# Patient Record
Sex: Male | Born: 1995 | Race: White | Hispanic: No | Marital: Single | Smoking: Never smoker
Health system: Southern US, Community
[De-identification: ages and names within clinical notes are randomized; demographics above are authoritative.]

---

## 2022-01-26 ENCOUNTER — Ambulatory Visit (INDEPENDENT_AMBULATORY_CARE_PROVIDER_SITE_OTHER): Payer: BC Managed Care – PPO

## 2022-01-26 ENCOUNTER — Other Ambulatory Visit: Payer: Self-pay

## 2022-01-26 ENCOUNTER — Ambulatory Visit
Admission: EM | Admit: 2022-01-26 | Discharge: 2022-01-26 | Disposition: A | Discharge status: Home / Self Care | Payer: BC Managed Care – PPO

## 2022-01-26 ENCOUNTER — Encounter: Payer: Self-pay | Admitting: Emergency Medicine

## 2022-01-26 DIAGNOSIS — M79641 Pain in right hand: Secondary | ICD-10-CM | POA: Diagnosis not present

## 2022-01-26 DIAGNOSIS — S62336A Displaced fracture of neck of fifth metacarpal bone, right hand, initial encounter for closed fracture: Secondary | ICD-10-CM

## 2022-01-26 NOTE — Discharge Instructions (Signed)
-  Unfortunately, you fractured your fifth metacarpal bone.  This is, and called a boxer's fracture.  It is a bit displaced we will need to definitely follow-up with orthopedics.  We have placed you in a brace and you should avoid using this hand until cleared by orthopedics.  You may take it out to ice it and you can take ibuprofen and Tylenol for pain. ? ?You have a condition requiring you to follow up with Orthopedics so please call one of the following office for appointment:  ? ?[facility] ?[address] [place], [place] [address] ?Phone: [phone] ? ?[facility] ?[address], [place], [place] [address] ?Phone: [phone]  ?

## 2022-01-26 NOTE — ED Triage Notes (Signed)
Pt c/o right hand pain. Started yesterday. He states he tripped over his dogs and slammed his hand into the corner of his coffee table. Pt has swelling in the area.   ?

## 2022-01-26 NOTE — ED Provider Notes (Signed)
?[facility] ? ? ? ?CSN: [id number] ?Arrival date & time: 01/26/22  3710 ? ? ?  ? ?History   ?Chief Complaint ?Chief Complaint  ?Patient presents with  ? Hand Injury  ?  right  ? ? ?HPI ?Timothy Rhodes is a 26 y.o. male presenting for right hand pain and swelling since yesterday.  Patient states he got stuck between his coffee table and couch and 2 of his dogs were in the way.  He states he fell forward and braced himself with his left hand and his right hand ended up basically slamming into/punching the coffee table.  Reports pain with any movement of the hand.  States he try to go to work today at [employer] but was unable to do his job because it hurt too much to use his right hand and he is right-handed.  He denies any associated numbness or tingling.  Says that he has fractured his scaphoid bone in his hand years ago.  He has iced the hand but not take anything for pain relief.  He has no other concerns. ? ?HPI ? ?History reviewed. No pertinent past medical history. ? ?There are no problems to display for this patient. ? ? ?History reviewed. No pertinent surgical history. ? ? ? ? ?Home Medications   ? ?Prior to Admission medications   ?Medication Sig Start Date End Date Taking? Authorizing Provider  ?amphetamine-dextroamphetamine (ADDERALL XR) 20 MG 24 hr capsule Take 20 mg by mouth every morning. 09/28/21   [provider]  ? ? ?Family History ?No family history on file. ? ?Social History ?Social History  ? ?Tobacco Use  ? Smoking status: Never  ? Smokeless tobacco: Never  ?Vaping Use  ? Vaping Use: Never used  ?Substance Use Topics  ? Alcohol use: Not Currently  ? Drug use: Not Currently  ? ? ? ?Allergies   ?Penicillins ? ? ?Review of Systems ?Review of Systems  ?Musculoskeletal:  Positive for arthralgias and joint swelling.  ?Skin:  Negative for color change and wound.  ?Neurological:  Negative for weakness and numbness.  ? ? ?Physical Exam ?Triage Vital Signs ?ED Triage Vitals  ?Enc  Vitals Group  ?   BP   ?   Pulse   ?   Resp   ?   Temp   ?   Temp src   ?   SpO2   ?   Weight   ?   Height   ?   Head Circumference   ?   Peak Flow   ?   Pain Score   ?   Pain Loc   ?   Pain Edu?   ?   Excl. in GC?   ? ?No data found. ? ?Updated Vital Signs ?BP (!) 146/78 (BP Location: Right Arm)   Pulse 94   Temp 98.1 ?F (36.7 ?C) (Oral)   Resp 18   Ht 6' (1.829 m)   Wt 160 lb (72.6 kg)   SpO2 98%   BMI 21.70 kg/m?  ?   ? ?Physical Exam ?Vitals and nursing note reviewed.  ?Constitutional:   ?   General: He is not in acute distress. ?   Appearance: Normal appearance. He is well-developed. He is not ill-appearing.  ?HENT:  ?   Head: Normocephalic and atraumatic.  ?Eyes:  ?   General: No scleral icterus. ?   Conjunctiva/sclera: Conjunctivae normal.  ?Cardiovascular:  ?   Rate and Rhythm: Normal rate.  ?  Pulses: Normal pulses.  ?Pulmonary:  ?   Effort: Pulmonary effort is normal. No respiratory distress.  ?Musculoskeletal:  ?   Right hand: Swelling (moderate swelling over metacarpals 3-5) and bony tenderness (3, 4, 5 metacarpals with most TTP of 5th metacarpal) present. Decreased range of motion. Normal capillary refill. Normal pulse.  ?   Cervical back: Neck supple.  ?Skin: ?   General: Skin is warm and dry.  ?   Capillary Refill: Capillary refill takes less than 2 seconds.  ?Neurological:  ?   General: No focal deficit present.  ?   Mental Status: He is alert. Mental status is at baseline.  ?   Motor: No weakness.  ?   Gait: Gait normal.  ?Psychiatric:     ?   Mood and Affect: Mood normal.     ?   Behavior: Behavior normal.     ?   Thought Content: Thought content normal.  ? ? ? ?UC Treatments / Results  ?Labs ?(all labs ordered are listed, but only abnormal results are displayed) ?Labs Reviewed - No data to display ? ?EKG ? ? ?Radiology ?DG Hand Complete Right ? ?Result Date: 01/26/2022 ?CLINICAL DATA:  Slammed hand into the coffee table yesterday. EXAM: RIGHT HAND - COMPLETE 3+ VIEW COMPARISON:  None.  FINDINGS: Displaced impaction fracture of the fifth metacarpal neck with mild radial/palmar angulation of the metacarpal head. Soft tissue swelling about the lateral aspect of the hand as expected. IMPRESSION: Displaced fifth metacarpal neck fracture. Electronically Signed   By: Larose HiresImran  Ahmed D.O.   On: 01/26/2022 08:56   ? ?Procedures ?Procedures (including critical care time) ? ?Medications Ordered in UC ?Medications - No data to display ? ?Initial Impression / Assessment and Plan / UC Course  ?I have reviewed the triage vital signs and the nursing notes. ? ?Pertinent labs & imaging results that were available during my care of the patient were reviewed by me and considered in my medical decision making (see chart for details). ? ?26 year old male presenting for right hand pain and swelling following an injury that occurred yesterday.  States he excellently punched/slammed his hand into a coffee table.  On exam he does have moderate swelling over the third, fourth and fifth metacarpals of the right hand.  He has tenderness in these areas as well with most tenderness over the fifth metacarpal.  Reduced range of motion of hand due to pain and swelling.  Good pulses.  X-ray obtained today shows displaced fifth metacarpal neck fracture.  Discussed this with patient.  Advised him that he will need to follow-up with orthopedics and gave him information for [facility] and [facility].  Patient placed in ulnar gutter splint and reviewed RICE guidelines, ibuprofen and Tylenol for pain.  I did offer him something stronger but he declines at this time.  Provided him with a work note. ? ? ?Final Clinical Impressions(s) / UC Diagnoses  ? ?Final diagnoses:  ?Closed displaced fracture of neck of fifth metacarpal bone of right hand, initial encounter  ?Right hand pain  ? ? ? ?Discharge Instructions   ? ?  ?-Unfortunately, you fractured your fifth metacarpal bone.  This is, and called a boxer's fracture.  It is a bit displaced  we will need to definitely follow-up with orthopedics.  We have placed you in a brace and you should avoid using this hand until cleared by orthopedics.  You may take it out to ice it and you can take ibuprofen and Tylenol for  pain. ? ?You have a condition requiring you to follow up with Orthopedics so please call one of the following office for appointment:  ? ?[facility] ?[address], [place], [place] [address] ?Phone: [phone] ? ?[facility] ?[address] [place], [place] [address] ?Phone: [phone]  ? ? ? ? ?ED Prescriptions   ?None ?  ? ?PDMP not reviewed this encounter. ?  ?Shirlee Latch, PA-C ?01/26/22 0913 ? ?

## 2023-03-16 IMAGING — CR DG HAND COMPLETE 3+V*R*
3 series · 3 of 3 positions shown · non-contrast
Comparison: None.

CLINICAL DATA: Slammed hand into the coffee table yesterday.

EXAM:
RIGHT HAND - COMPLETE 3+ VIEW

[hand ap]
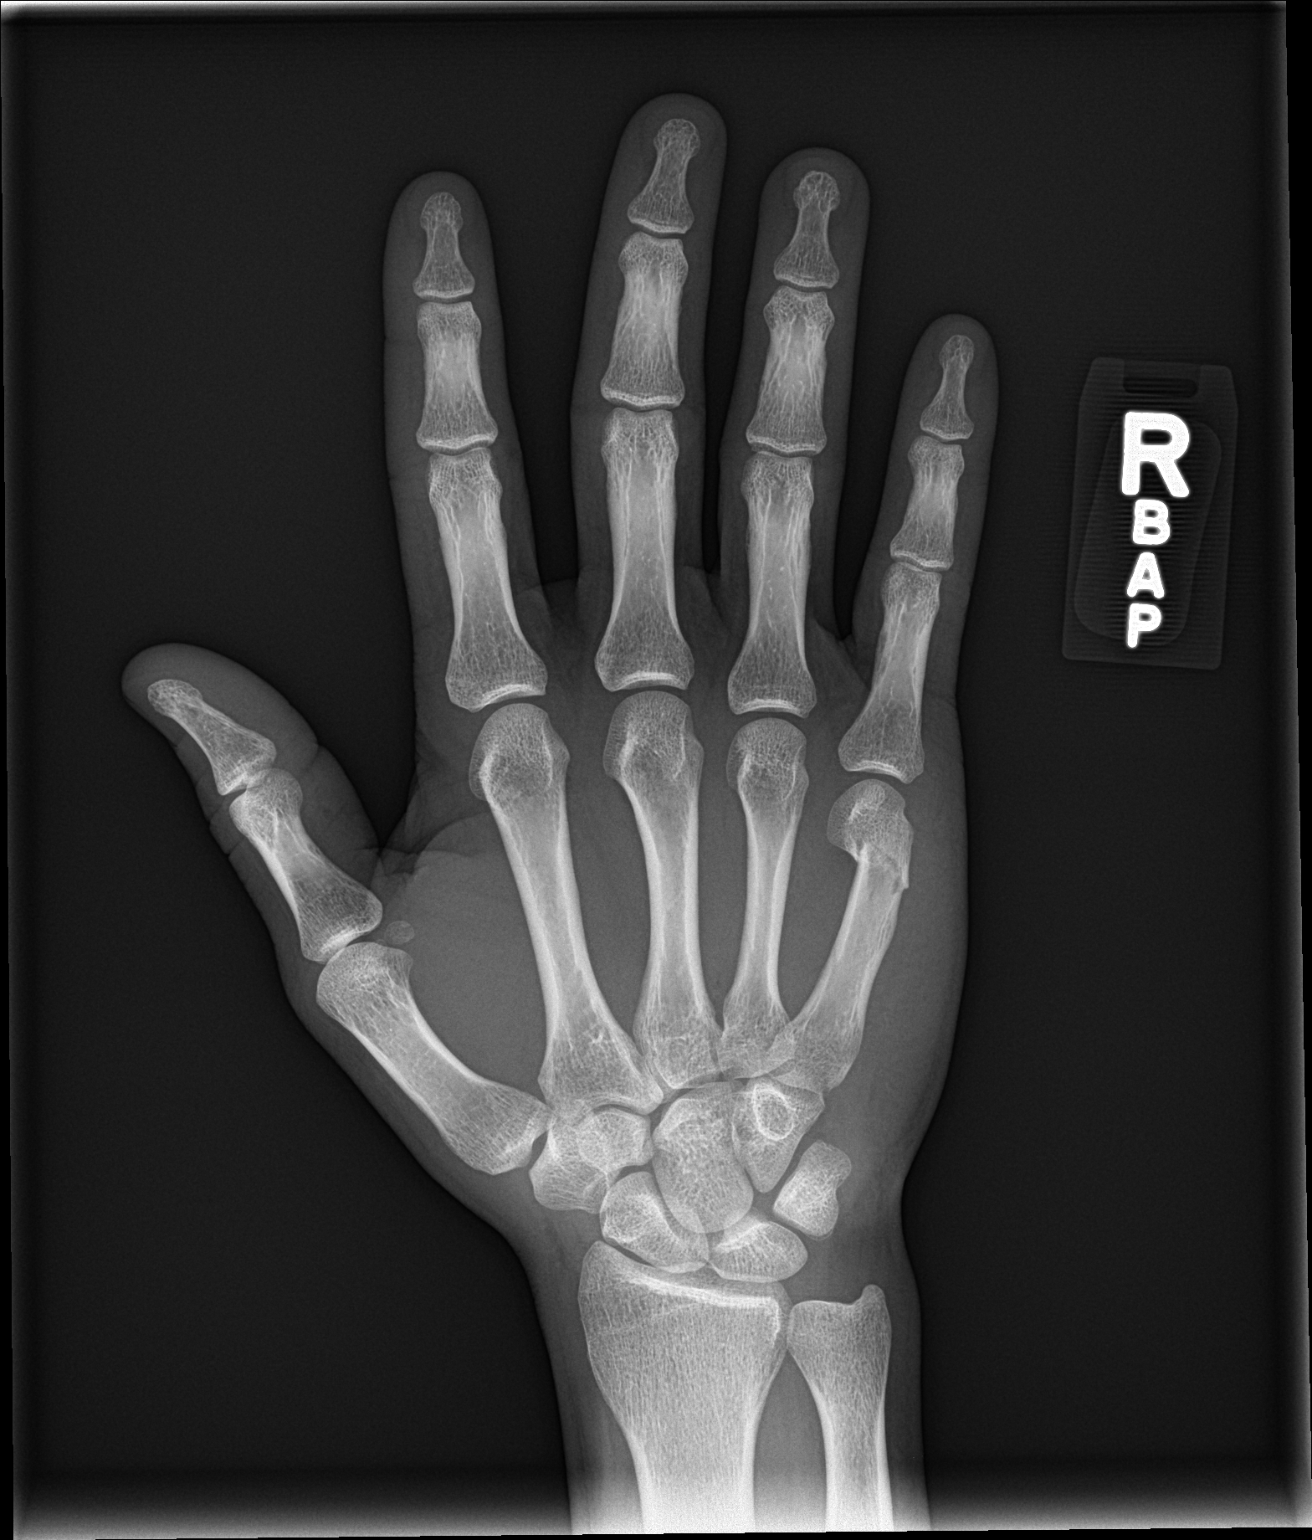

[hand obl]
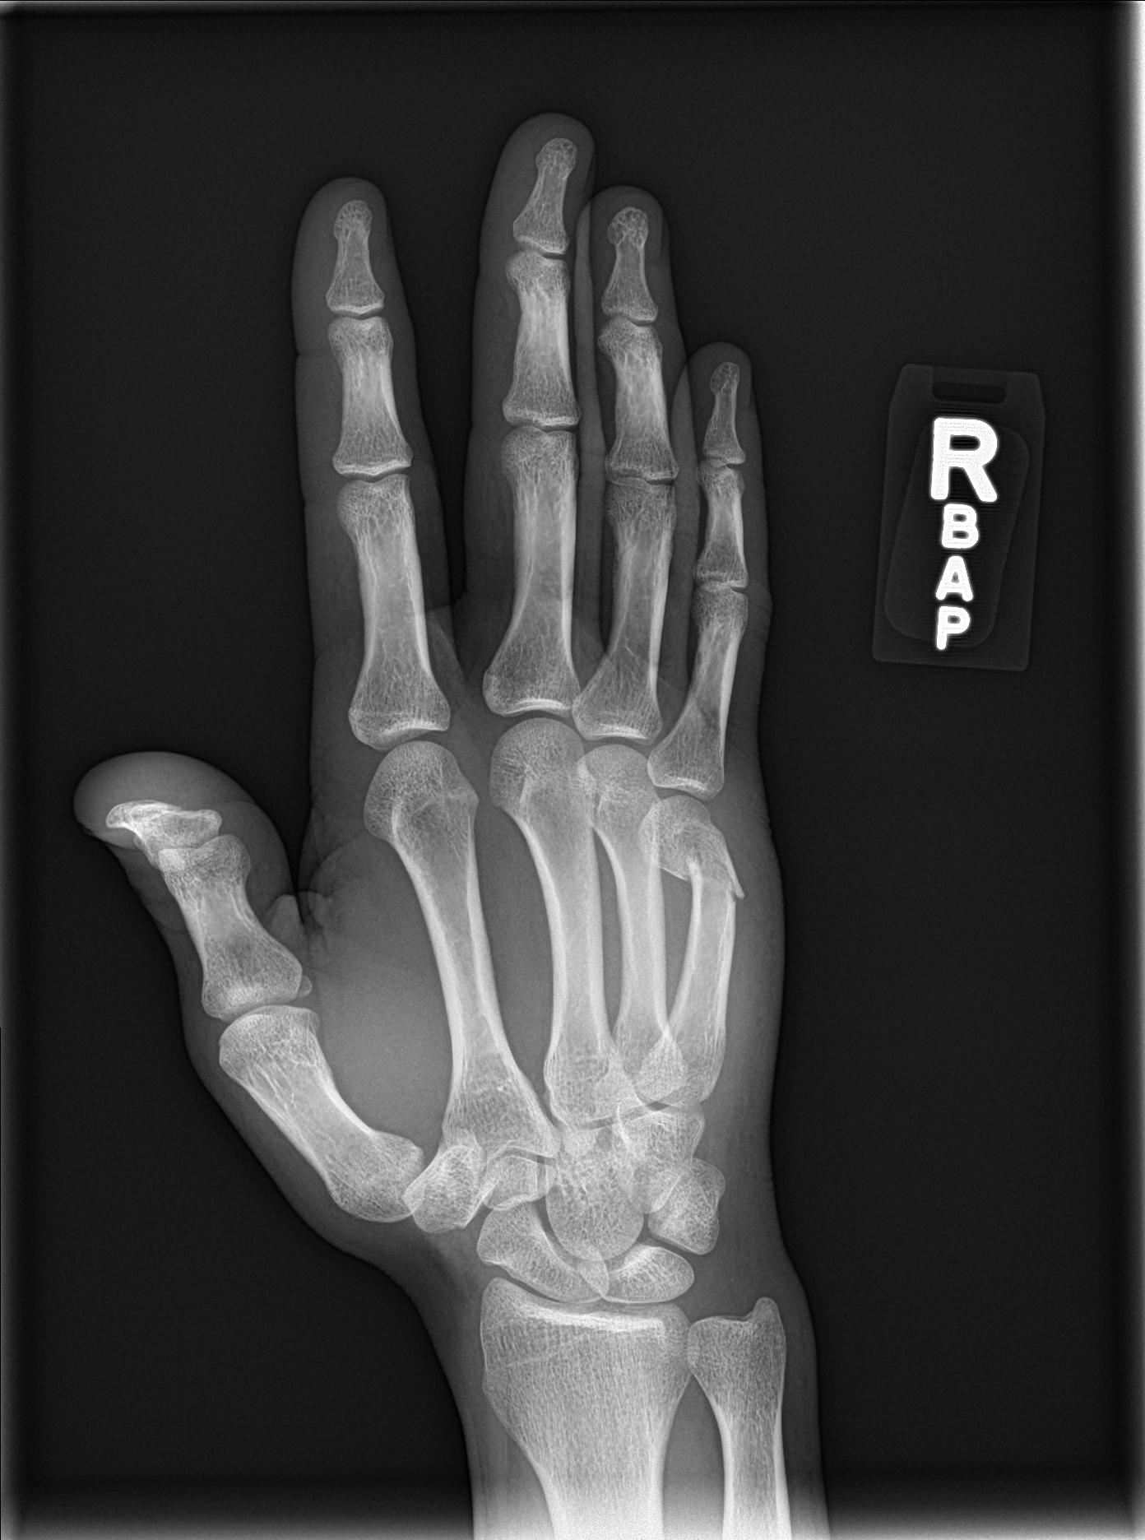

[hand lat]
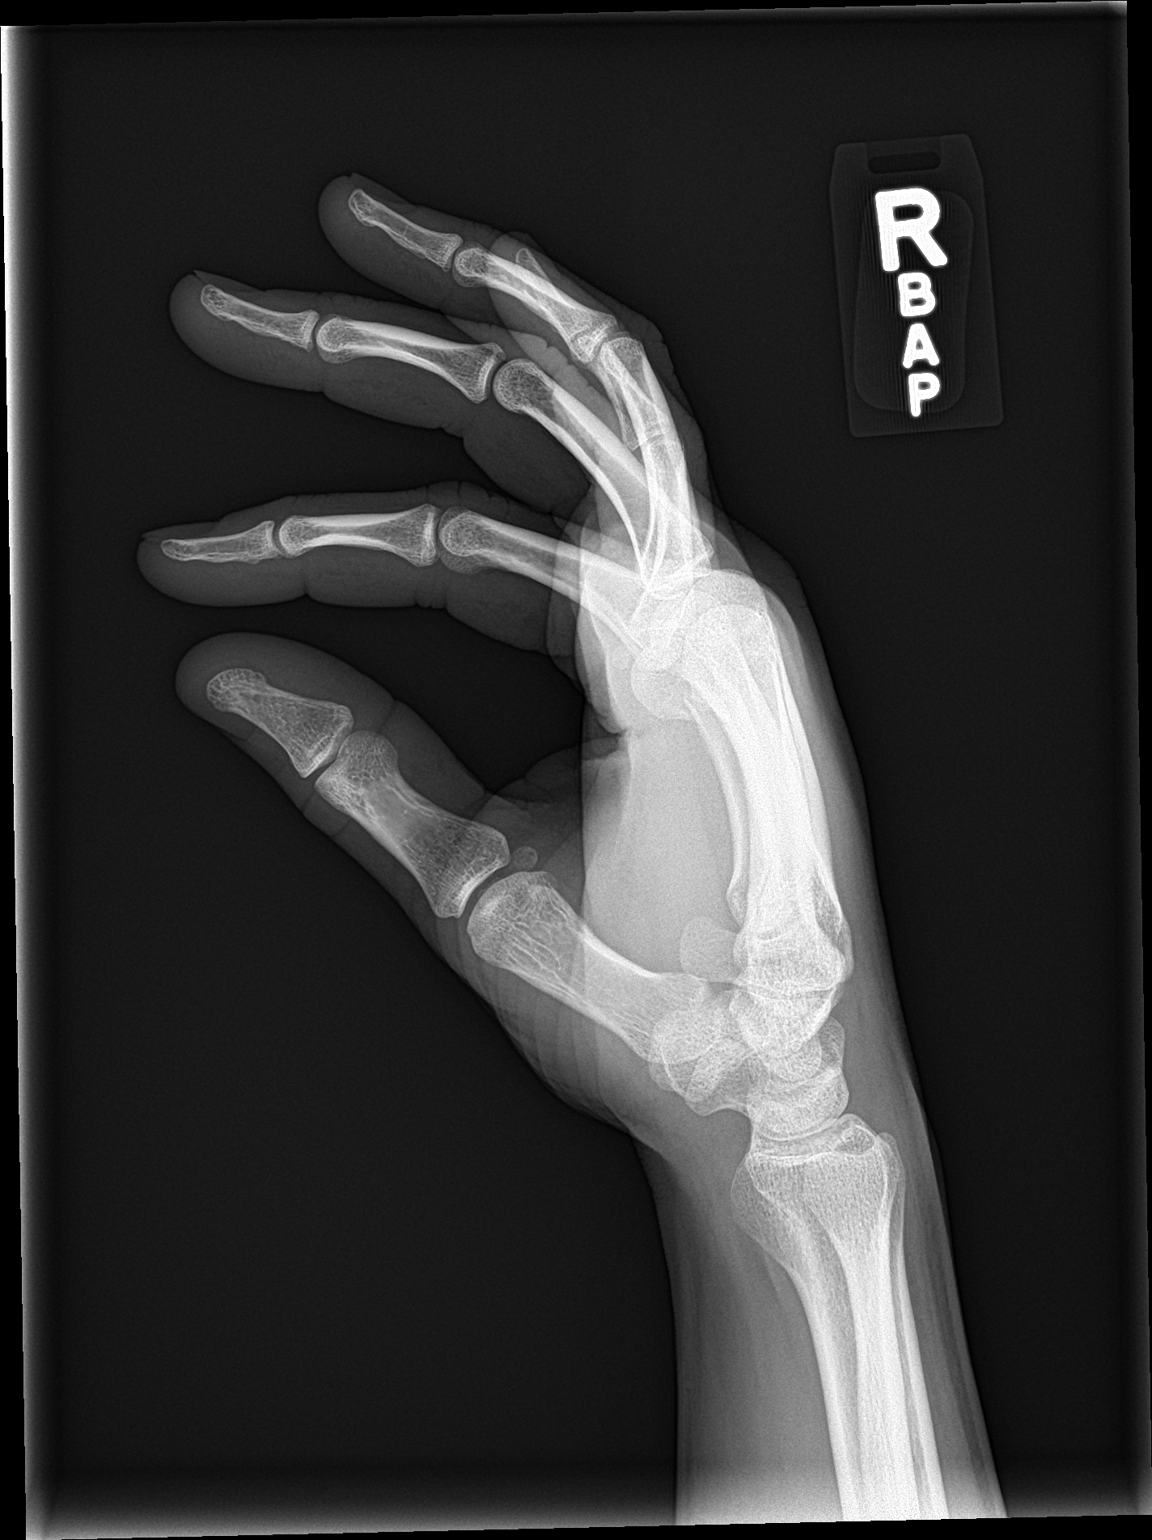

[3 of 3 positions shown; findings below may reference images not displayed]

FINDINGS: Displaced impaction fracture of the fifth metacarpal neck with mild
radial/palmar angulation of the metacarpal head. Soft tissue
swelling about the lateral aspect of the hand as expected.
IMPRESSION: Displaced fifth metacarpal neck fracture.
